# Patient Record
Sex: Female | Born: 1935 | Race: White | Hispanic: No
Health system: Southern US, Community
[De-identification: ages and names within clinical notes are randomized; demographics above are authoritative.]

---

## 2004-07-02 ENCOUNTER — Ambulatory Visit: Payer: Self-pay | Admitting: Ophthalmology

## 2004-07-10 ENCOUNTER — Ambulatory Visit: Payer: Self-pay | Admitting: Ophthalmology

## 2005-08-19 ENCOUNTER — Ambulatory Visit: Payer: Self-pay | Admitting: Internal Medicine

## 2007-04-24 ENCOUNTER — Ambulatory Visit: Payer: Self-pay | Admitting: Internal Medicine

## 2007-07-21 ENCOUNTER — Emergency Department: Payer: Self-pay | Admitting: Emergency Medicine

## 2008-02-15 ENCOUNTER — Ambulatory Visit: Payer: Self-pay | Admitting: Neurology

## 2008-02-25 ENCOUNTER — Other Ambulatory Visit: Payer: Self-pay

## 2008-02-25 ENCOUNTER — Inpatient Hospital Stay: Payer: Self-pay | Admitting: Internal Medicine

## 2009-09-20 ENCOUNTER — Ambulatory Visit: Payer: Self-pay | Admitting: Surgery

## 2010-06-03 ENCOUNTER — Emergency Department: Payer: Self-pay | Admitting: Emergency Medicine

## 2010-06-05 ENCOUNTER — Inpatient Hospital Stay: Payer: Self-pay | Admitting: General Practice

## 2010-09-27 ENCOUNTER — Emergency Department: Payer: Self-pay | Admitting: Unknown Physician Specialty

## 2011-09-18 ENCOUNTER — Ambulatory Visit: Payer: Self-pay | Admitting: Internal Medicine

## 2011-10-10 ENCOUNTER — Observation Stay: Payer: Self-pay | Admitting: Internal Medicine

## 2011-10-10 LAB — CBC WITH DIFFERENTIAL/PLATELET
Eosinophil #: 0.3 10*3/uL (ref 0.0–0.7)
Lymphocyte %: 16.4 %
MCH: 31.5 pg (ref 26.0–34.0)
MCHC: 32.3 g/dL (ref 32.0–36.0)
MCV: 97 fL (ref 80–100)
Neutrophil #: 10.1 10*3/uL — ABNORMAL HIGH (ref 1.4–6.5)
Neutrophil %: 75.3 %
Platelet: 501 10*3/uL — ABNORMAL HIGH (ref 150–440)

## 2011-10-10 LAB — COMPREHENSIVE METABOLIC PANEL
Albumin: 2.7 g/dL — ABNORMAL LOW (ref 3.4–5.0)
Alkaline Phosphatase: 75 U/L (ref 50–136)
Anion Gap: 7 (ref 7–16)
BUN: 23 mg/dL — ABNORMAL HIGH (ref 7–18)
Bilirubin,Total: 0.4 mg/dL (ref 0.2–1.0)
Calcium, Total: 8.7 mg/dL (ref 8.5–10.1)
Co2: 30 mmol/L (ref 21–32)
EGFR (African American): >60
EGFR (Non-African Amer.): >60
Osmolality: 285 (ref 275–301)
SGOT(AST): 17 U/L (ref 15–37)
SGPT (ALT): 15 U/L

## 2011-10-10 LAB — URINALYSIS, COMPLETE
Bilirubin,UR: NEGATIVE
Nitrite: NEGATIVE
Protein: 30
WBC UR: 128 /HPF (ref 0–5)

## 2011-10-10 LAB — PROTIME-INR: INR: 1.1

## 2011-10-10 LAB — APTT: Activated PTT: 31.5 secs (ref 23.6–35.9)

## 2011-10-10 LAB — HEMOGLOBIN: HGB: 13.7 g/dL (ref 12.0–16.0)

## 2011-10-11 LAB — CBC WITH DIFFERENTIAL/PLATELET
Basophil #: 0.1 10*3/uL (ref 0.0–0.1)
HCT: 41.2 % (ref 35.0–47.0)
HGB: 13.2 g/dL (ref 12.0–16.0)
Lymphocyte #: 1.8 10*3/uL (ref 1.0–3.6)
MCHC: 32 g/dL (ref 32.0–36.0)
MCV: 97 fL (ref 80–100)
Monocyte #: 0.6 x10 3/mm (ref 0.2–0.9)
Neutrophil #: 9.6 10*3/uL — ABNORMAL HIGH (ref 1.4–6.5)
Neutrophil %: 77.9 %
RBC: 4.25 10*6/uL (ref 3.80–5.20)
RDW: 16 % — ABNORMAL HIGH (ref 11.5–14.5)
WBC: 12.4 10*3/uL — ABNORMAL HIGH (ref 3.6–11.0)

## 2011-10-11 LAB — BASIC METABOLIC PANEL
Anion Gap: 9 (ref 7–16)
Calcium, Total: 8.2 mg/dL — ABNORMAL LOW (ref 8.5–10.1)
Chloride: 107 mmol/L (ref 98–107)
Co2: 27 mmol/L (ref 21–32)
Creatinine: 0.67 mg/dL (ref 0.60–1.30)
EGFR (Non-African Amer.): >60
Potassium: 3.8 mmol/L (ref 3.5–5.1)
Sodium: 143 mmol/L (ref 136–145)

## 2011-10-14 LAB — URINE CULTURE

## 2011-10-19 ENCOUNTER — Ambulatory Visit: Payer: Self-pay | Admitting: Internal Medicine

## 2012-09-10 IMAGING — CT CT HEAD WITHOUT CONTRAST
2 series · 15 of 30 positions shown, 19 images · non-contrast
Comparison: none

REASON FOR EXAM: trauma
COMMENTS:

[Series 2: without · axial · non-contrast · 0.42mm/px · z∈[+468,+588]mm · 13 of 29 slices shown, 17 images]
[im 3/29  brain]
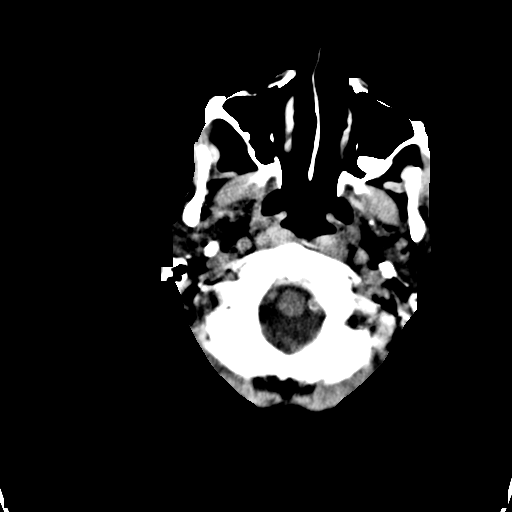
[im 3/29  bone]
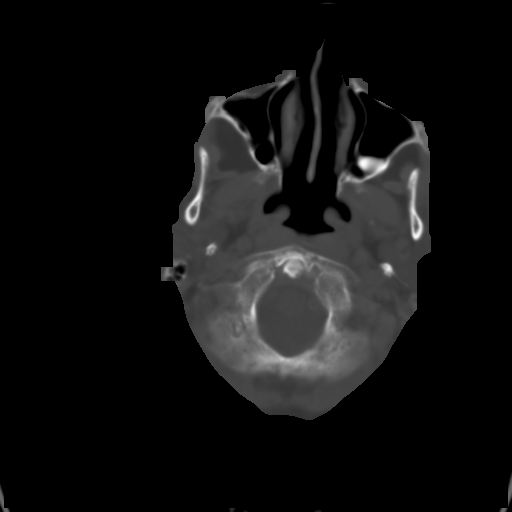
[im 5/29  brain]
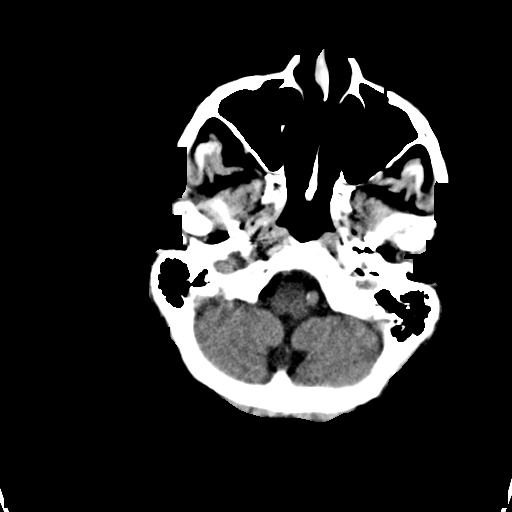
[im 7/29  brain]
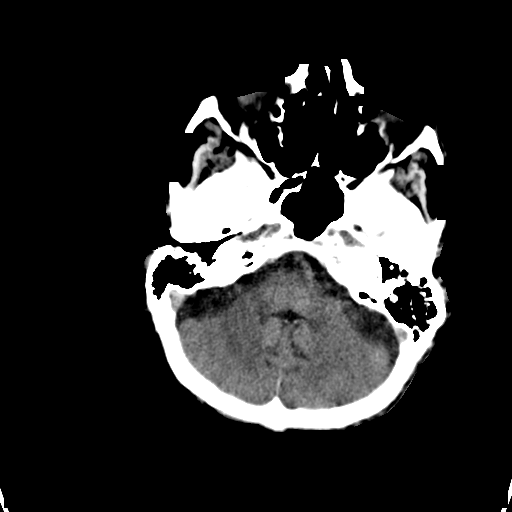
[im 9/29  brain]
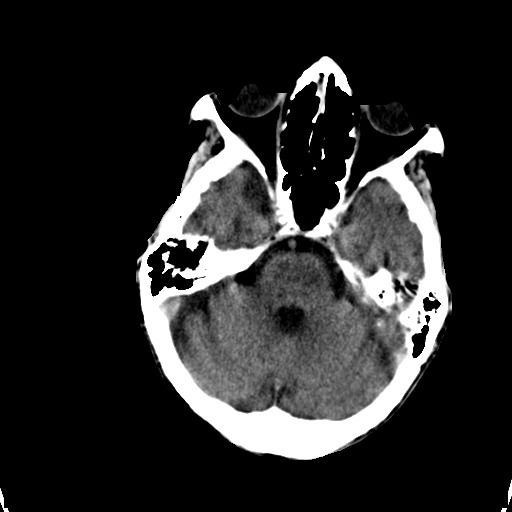
[im 11/29  brain]
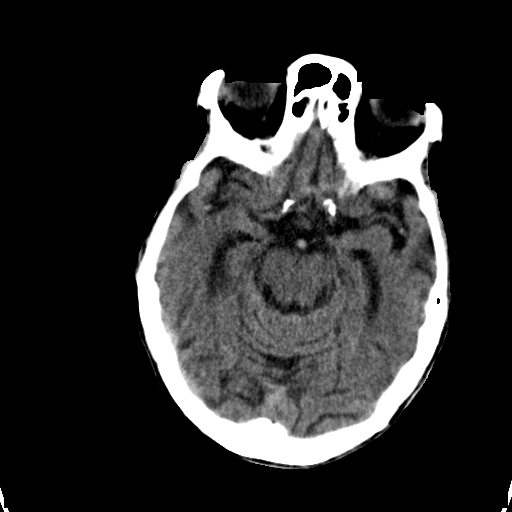
[im 11/29  bone]
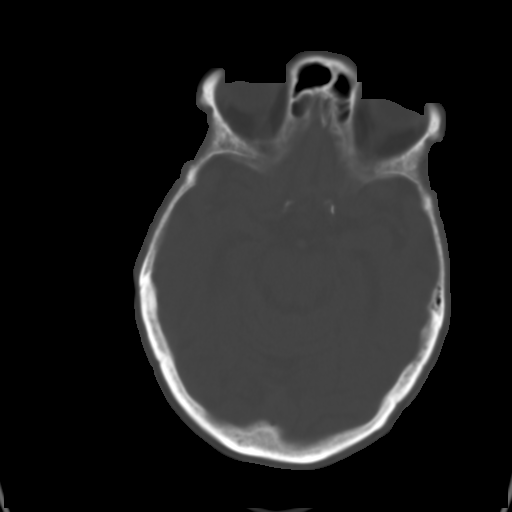
[im 13/29  brain]
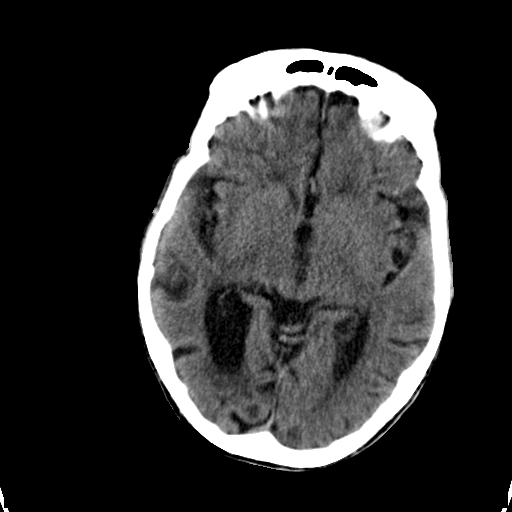
[im 15/29  brain]
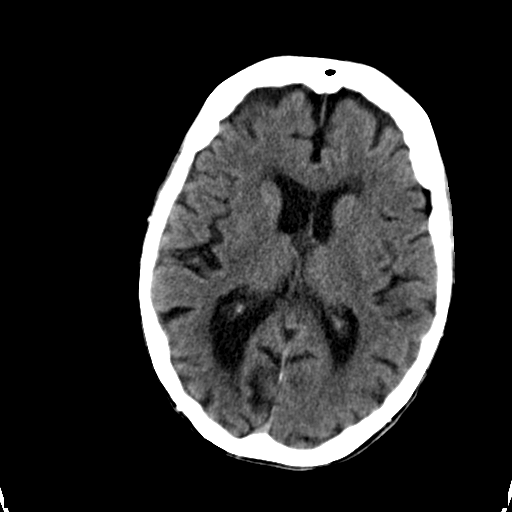
[im 17/29  brain]
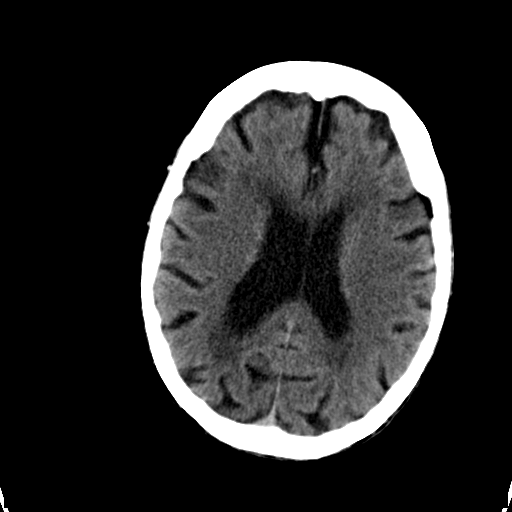
[im 19/29  brain]
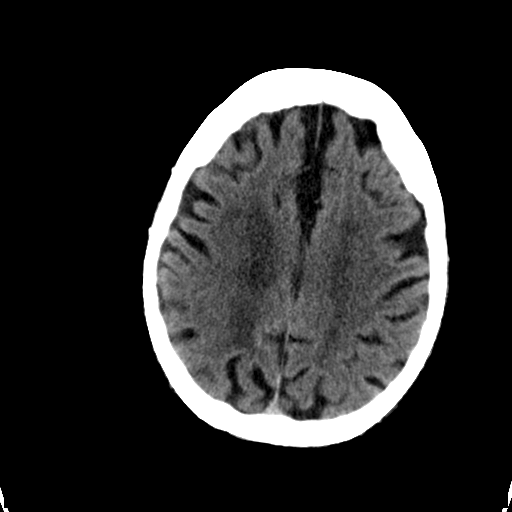
[im 19/29  bone]
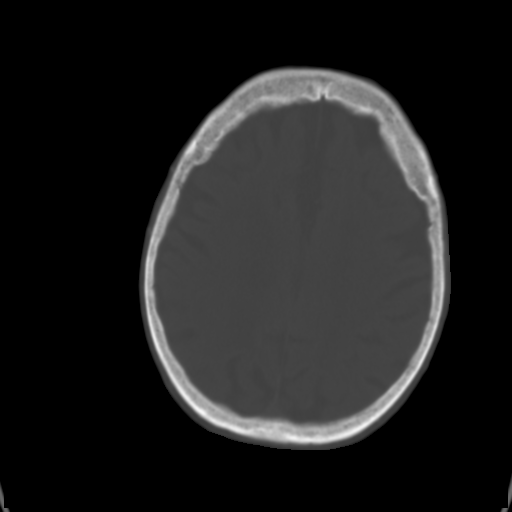
[im 21/29  brain]
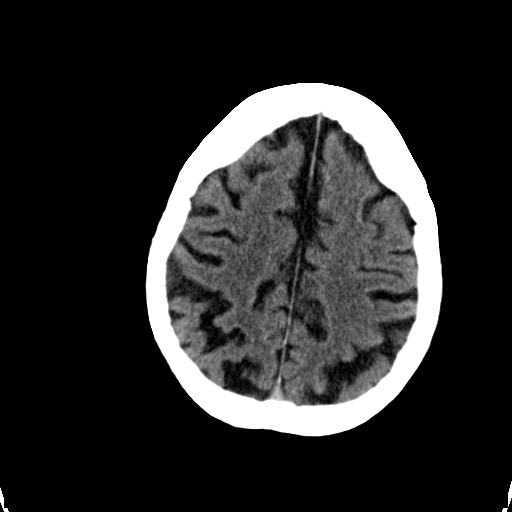
[im 23/29  brain]
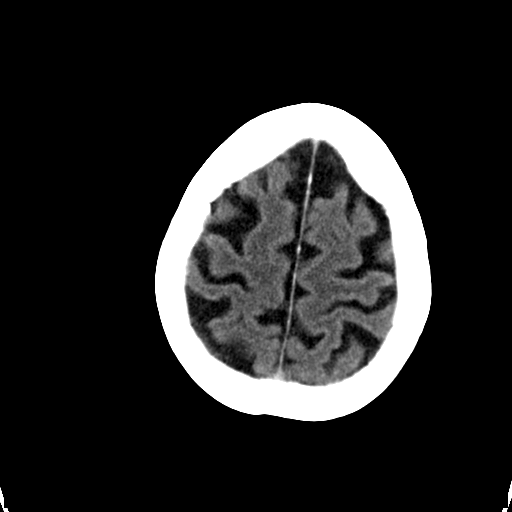
[im 25/29  brain]
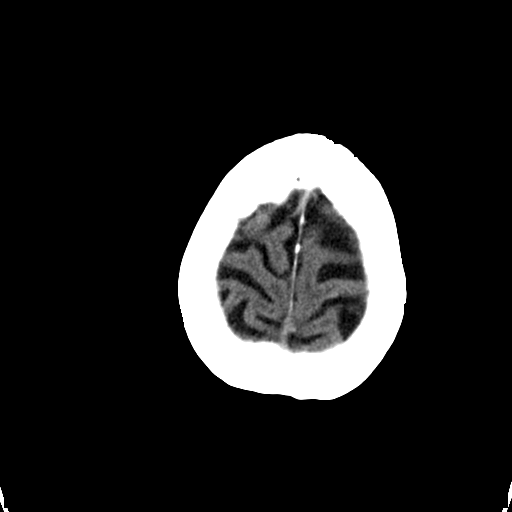
[im 27/29  brain]
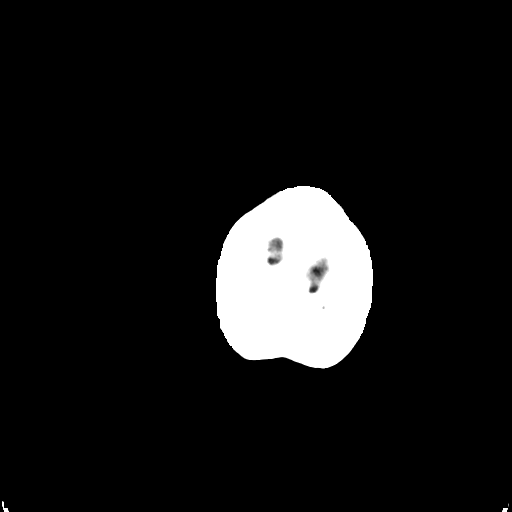
[im 27/29  bone]
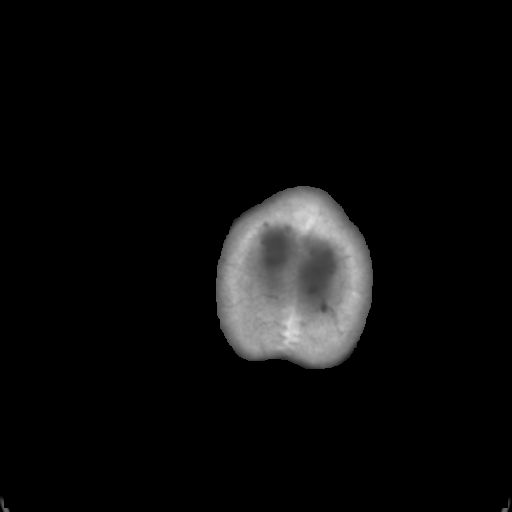

[Series 3: bone · axial · 0.42mm/px · z∈[+468,+488]mm · 2 of 29 slices shown]
[im 3/29  bone]
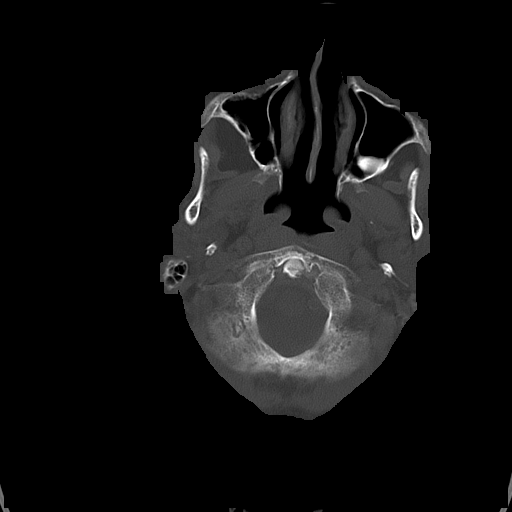
[im 7/29  bone]
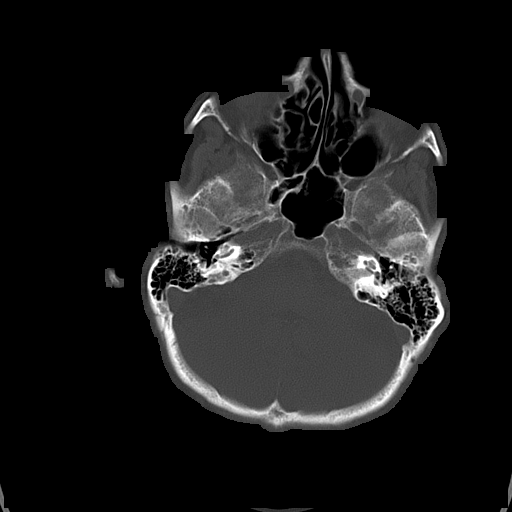

[15 of 30 positions shown; findings below may reference images not displayed]

PROCEDURE:     CT  - CT HEAD WITHOUT CONTRAST  - September 27, 2010  [DATE]

RESULT:     Emergent CT of the brain is compared to previous exam dated
06/03/2010. There is prominence of the ventricles and sulci consistent with
atrophy. Diffuse low-attenuation is present within the periventricular and
subcortical white matter regions most consistent with chronic small vessel
ischemic disease. Tiny old occipital infarcts appear present bilaterally.
Atherosclerotic calcification is present. There is no intracranial
hemorrhage, mass or mass effect. No midline shift is appreciated. The
included sinuses and mastoid air cells show grossly normal aeration. The
calvarium appears intact.
IMPRESSION: 1. Atrophy with chronic small vessel ischemic disease. The appear to be tiny
old occipital infarcts. Somewhat asymmetric atrophy in this region cannot be
excluded. No definite acute intracranial abnormality or significant interval
change appreciated.

## 2013-05-20 DEATH — deceased

## 2013-09-23 IMAGING — CT CT ABD-PELV W/O CM
1 of 2 series · 15 of 32 positions shown, 19 images · non-contrast
Comparison: none

REASON FOR EXAM: (1) abdominal pain, lower gi bleed; (2) lower gi bleed,
h/o colon ca.  r/o mass
COMMENTS:

PROCEDURE:     CT  - CT ABDOMEN AND PELVIS W[DATE]  [DATE]
RESULT:
TECHNIQUE: Helical noncontrasted 3 mm sections were obtained from the lung
bases through the pubic symphysis.

[Series 2: 3mm soft tissue · axial · 0.62mm/px · z∈[-926,-560]mm · 15 of 134 slices shown, 19 images]
[im 6/134  soft-tissue]
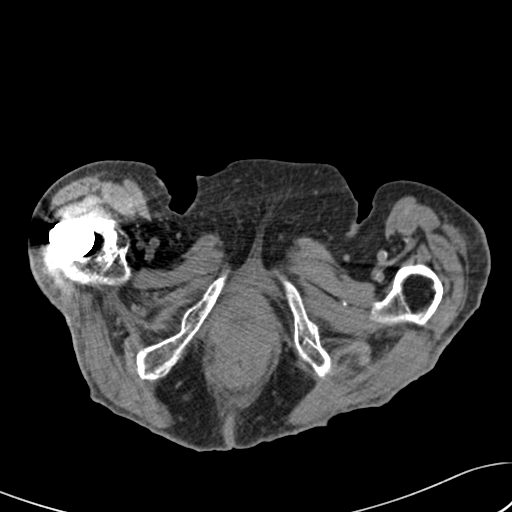
[im 6/134  bone]
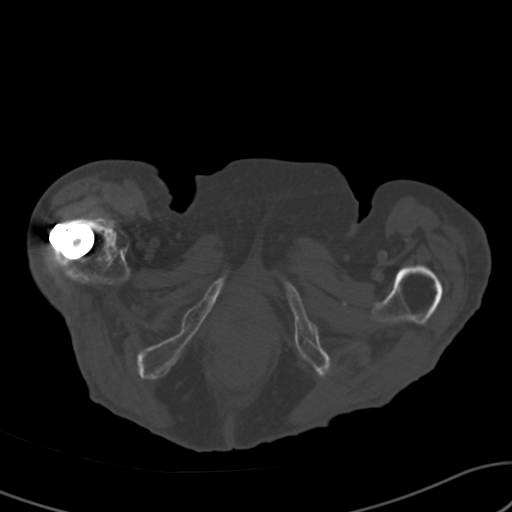
[im 17/134  soft-tissue]
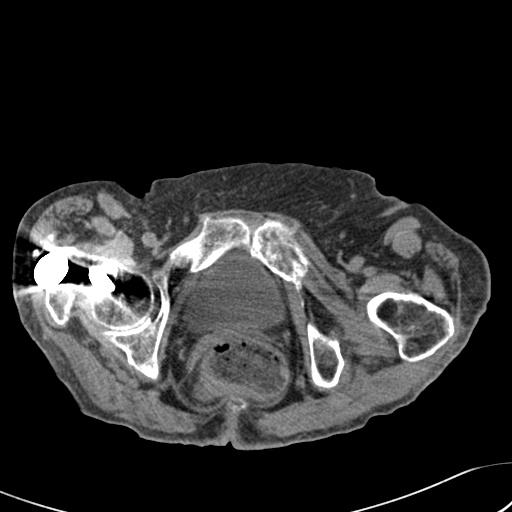
[im 28/134  soft-tissue]
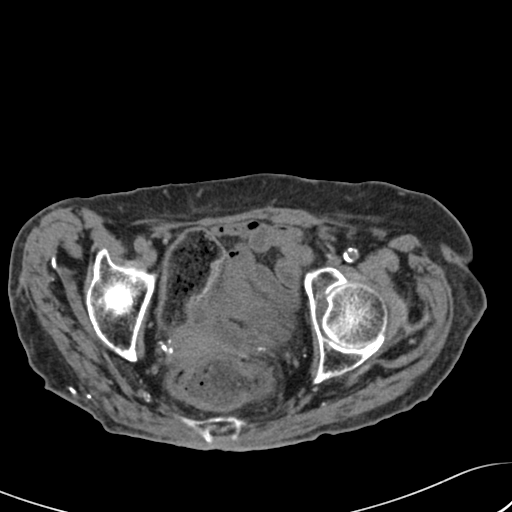
[im 39/134  soft-tissue]
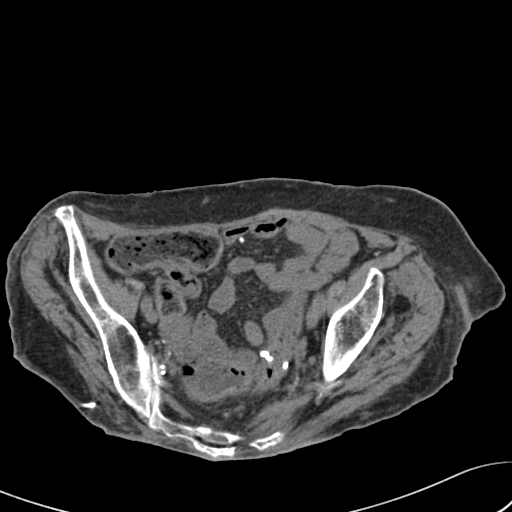
[im 45/134  soft-tissue]
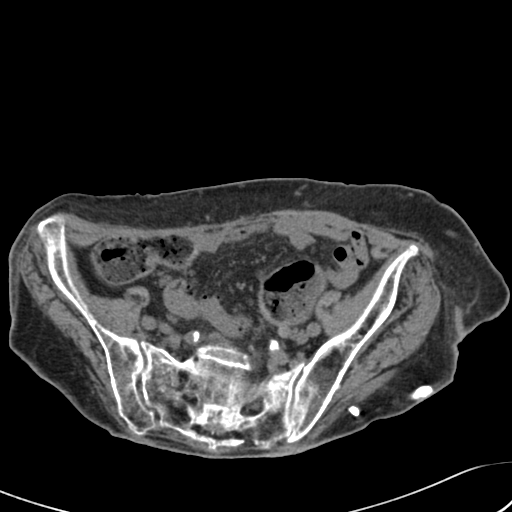
[im 56/134  soft-tissue]
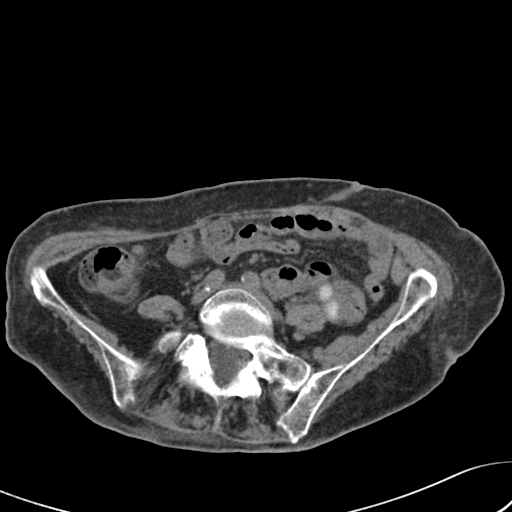
[im 67/134  soft-tissue]
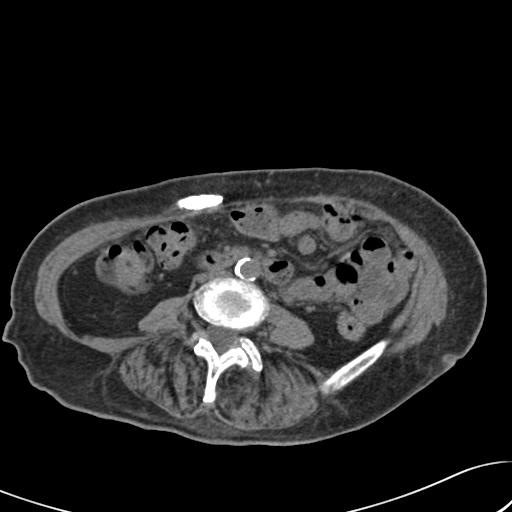
[im 78/134  soft-tissue]
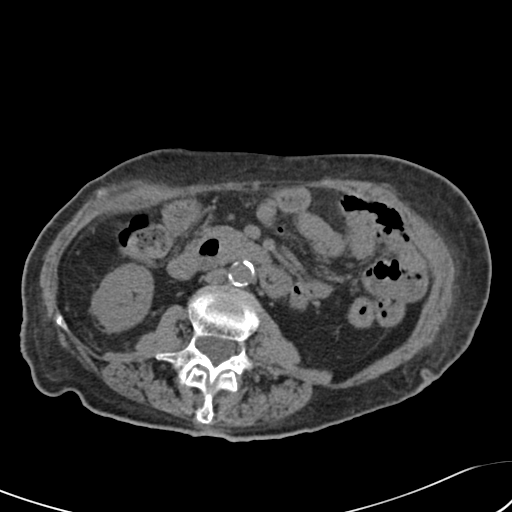
[im 89/134  soft-tissue]
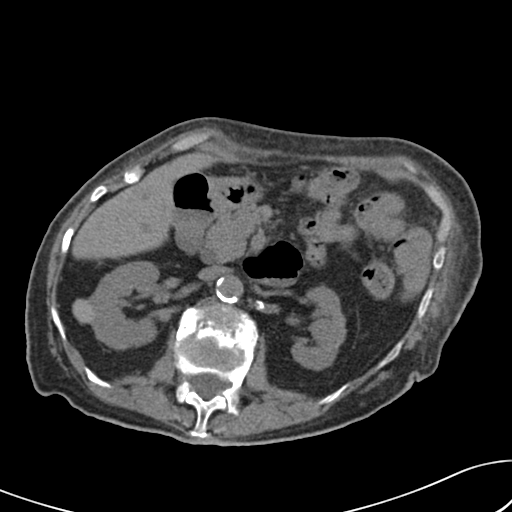
[im 89/134  bone]
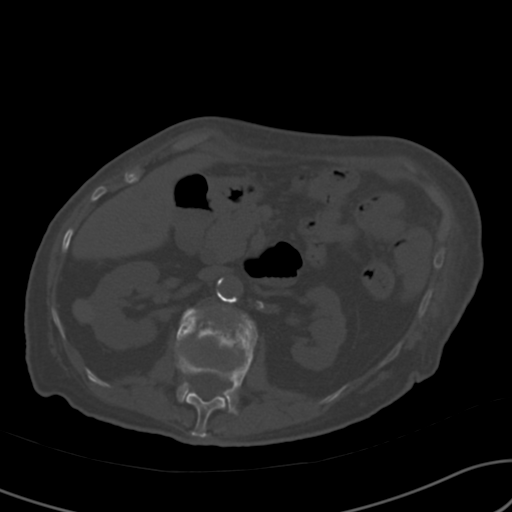
[im 95/134  soft-tissue]
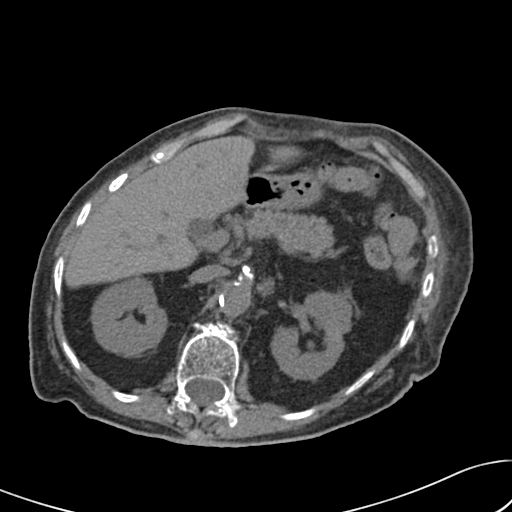
[im 106/134  soft-tissue]
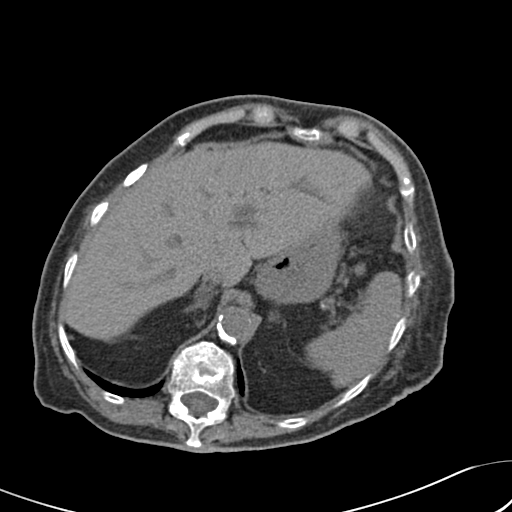
[im 111/134  lung]
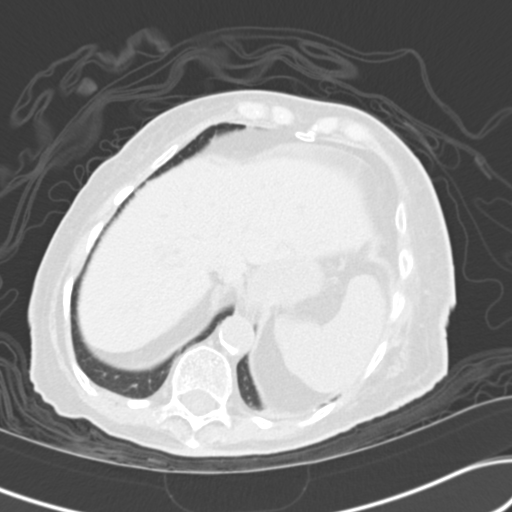
[im 117/134  soft-tissue]
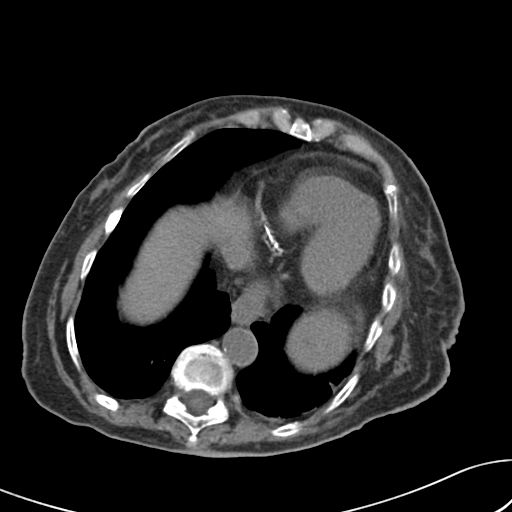
[im 117/134  lung]
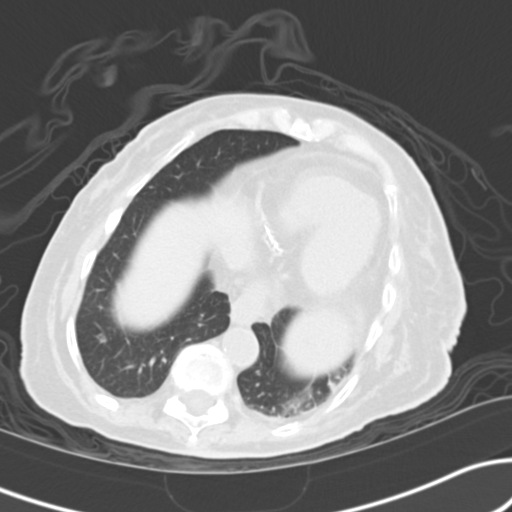
[im 122/134  lung]
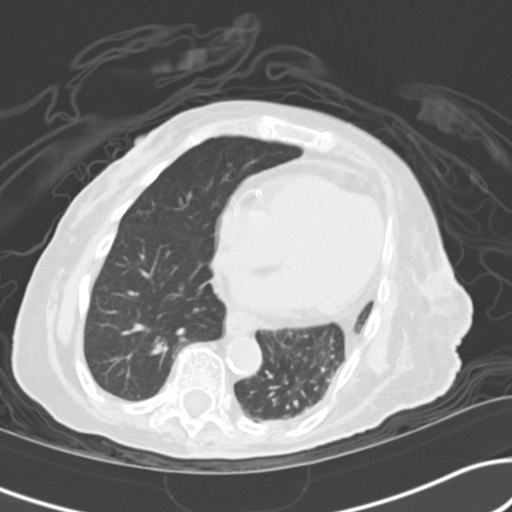
[im 128/134  soft-tissue]
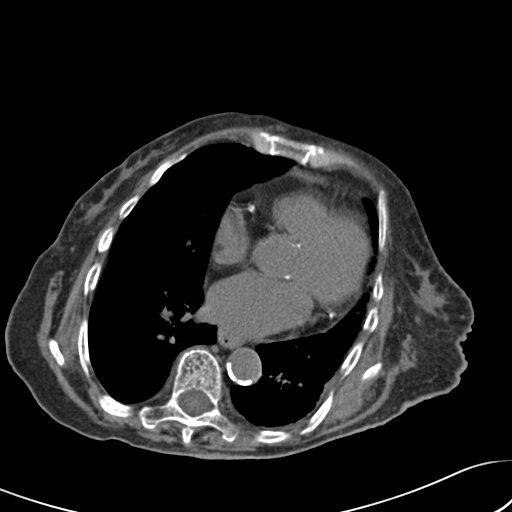
[im 128/134  lung]
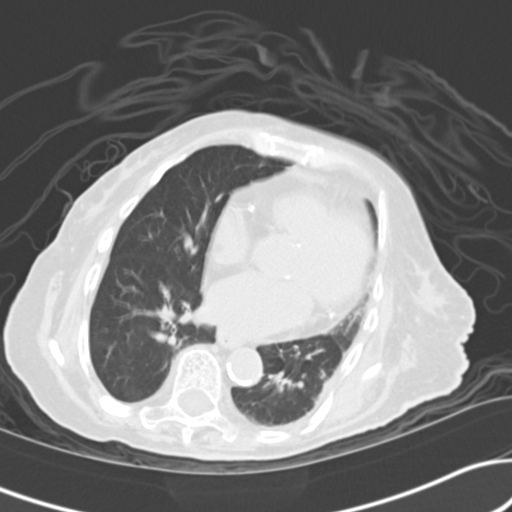

[15 of 32 positions shown; findings below may reference images not displayed]

FINDINGS: The lung bases demonstrate emphysematous changes. There are linear
areas of increased density likely representing a fibrotic component.
Coronary artery calcification is appreciated as well as a small hiatal
hernia.

Noncontrast evaluation of the liver, spleen, and pancreas is unremarkable.
The gallbladder is not identified. There is a hyperdense right renal lesion
measuring 1.5 cm. Cortical scarring of the left kidney is identified.
Bilateral renal cysts are identified. There are calcifications at the margin
of the left renal cyst. There is no hydronephrosis or perinephric edema.

Surgical anastomosis of the rectosigmoid colon is identified. There is no
increased fecal material in the rectosigmoid and ascending colon. There are
two radiopaque disc-like structures in the descending colon, appearance
suggesting pills. There is no abnormal large or small bowel dilation. There
is no bowel wall thickening or edema. There is no evidence of pneumatosis
nor pneumoperitoneum. The appendix is unremarkable. The uterus is tilted to
the right of midline. There is no evidence of adnexal masses. There is no
free fluid in the pelvis. Calcified injection granulomas are identified
within the gluteal regions. Large, coarse calcifications in the right
anterior and mid abdominal wall likely represent injection granulomas. Right
hip pin is identified. Old pubic ramus fracture is identified.
IMPRESSION: 1. No evidence of acute inflammatory or obstructive abnormalities. Moderate
amount of stool is identified.
2. Complex renal cysts. Solid lesion cannot be excluded. This involves the
right kidney.
3. Dr. Andoyo of the Emergency Department was informed of these findings
via a preliminary faxed report dated 10/10/2011 at [DATE] a.m. [employer]
Time.

## 2014-09-11 NOTE — Discharge Summary (Signed)
PATIENT NAME:  Meredith Cain, Sirenity M MR#:  [id number] DATE OF BIRTH:  Jul 20, 1919  DATE OF ADMISSION:  10/10/2011 DATE OF DISCHARGE:  10/11/2011  DISCHARGE DIAGNOSES:  1. Lower gastrointestinal bleed. Patient's power of attorney refused further work-up.  2. Hypertension.  3. Colon cancer.  4. Dementia.  5. Osteoporosis.   DISCHARGE MEDICATIONS:  1. Amlodipine 10 mg daily. 2. Aricept 10 mg p.o. daily.  3. Aspirin to be stopped.  4. Colace 100 mg p.o. b.i.d.  5. Maalox 15 mL once a day at night.  6. Metoprolol 20 mg p.o. b.i.d.  7. MiraLax 17 grams in water.  8. Namenda 10 mg daily. 9. Pristiq 50 mg extended release 1 tablet daily.  10. Ranitidine 15 mg/ mL, take 10 mL that is 150 mg daily.  11. Risperdal 0.25 mg 1 tablet p.o. b.i.d.  12. Senna as needed.   13. Xalatan 0.005% eyedrops in each eye once a day that is at bedtime.    HOSPITAL COURSE: 81 year [facility] because of dark red stools multiple times on 22nd. Patient is a poor historian. According to the patient's granddaughter, patient had tiny bloody stool before and patient complained of epigastric pain without radiation.   Patient is admitted to hospitalist service and monitored on telemetry. Patient did not have any further episodes of rectal bleeding. Initial hemoglobin was 13.1, hematocrit 41.3. Patient did stay hemodynamically stable. Her hemoglobin did not drop and it did stay around 13.7 and most recent one today 13.2 and 41.2. Patient's aspirin was stopped.   Patient has granddaughter, who is , power of attorney and palliative care has seen the patient and has been contacting the power of attorney and she does not want any further workup for etiology of the bleeding. Patient because of hemoglobin stable [facility] Patient is followed by [facility] and CODE STATUS is DO NOT RESUSCITATE.    Urinary tract infection. Patient's urinalysis showed positive  leukocyte esterase and also positive bacteria 3+ and WBC 128. She started Rocephin. Cultures are pending but will empirically send her on Cipro 500 mg b.i.d. for five days.   Hypertension. Blood pressure has been running high with 154/58 with heart rate being 61 so metoprolol dose has been not changed and increased Norvasc to 10 mg daily and she can continue that.   History of dementia. She is on Namenda, Aricept and Risperdal; continue them.   LABORATORY, DIAGNOSTIC AND RADIOLOGICAL DATA: Patient's renal function stayed stable. BUN 13, creatinine 0.67. CT of the abdomen also is done on 23rd which shows lungs bases with emphysema and coronary artery calcifications. Noncontrast evaluation of liver, pancreas, spleen are normal. Gallbladder not identified. Hyperdense right lesion 1.5 cm and cortical scarring of left kidney and bilateral renal cysts are present. Patient has no hydronephrosis. No perinephric edema. Surgical anastomotic of rectosigmoid junction is present. No increase in fecal material at the rectosigmoid junction. Patient has no abnormal appearance of large or small bowel, no bowel wall thickening or edema. No evidence of pneumatosis or pneumoperitoneum. Appendix unremarkable. No free fluid.   CONDITION: Condition is stable.   TIME SPENT ON DISCHARGE PREPARATION: More than 30 minutes.   CODE STATUS: DO NOT RESUSCITATE.  ____________________________ Katha HammingSnehalatha Jamespaul Secrist, MD sk:cms D: 10/11/2011 10:02:03 ET T: 10/14/2011 10:29:27 ET  JOB#: [id number] cc: Katha HammingSnehalatha Shahzad Thomann, MD, <Dictator>  Katha HammingSNEHALATHA Calyn Sivils MD ELECTRONICALLY SIGNED 10/17/2011 11:13

## 2014-09-11 NOTE — H&P (Signed)
PATIENT NAME:  Meredith Cain, Meredith Cain MR#:  [id number] DATE OF BIRTH:  11-14-1919  DATE OF ADMISSION:  10/10/2011  PRIMARY CARE PHYSICIAN: Dr. Harrington Challenger    REFERRING PHYSICIAN: Dr. Jens Som   CHIEF COMPLAINT: Bloody stool yesterday.   HISTORY OF PRESENT ILLNESS: The patient is a 81 year old Caucasian female comes from nursing home with a history of hypertension, diabetes, colon cancer, and dementia who was found to have bloody stool multiple times yesterday which is dark red. The patient is demented and cannot provide detailed history. According to her granddaughter, the patient had tiny bloody stool before. The patient has no nausea, vomiting, or diarrhea. The patient said she has epigastric pain which is intermittent and mild without radiation. She denies any other symptoms. The patient's urinalysis showed urinary tract infection, was treated with Cipro in the ED. According to the patient's granddaughter, she does not want aggressive treatment since the patient is a DO NOT RESUSCITATE code status.   PAST MEDICAL HISTORY:  1. Hypertension.  2. Diabetes.  3. Colon cancer. 4. Dementia.   SOCIAL HISTORY: Nursing home resident since this January. The patient lived with her granddaughter for a long time. No smoking, alcohol drinking, or illicit drugs.   PAST SURGICAL HISTORY:  1. Colon cancer surgery. 2. Total hysterectomy.  3. Right knee surgery.   FAMILY HISTORY: The patient's daughter died of lung cancer. Unclear about her parents.   ALLERGIES: Codeine.   MEDICATIONS:  1. Ambien 5 mg p.o. at bedtime.  2. Norvasc 5 mg p.o. daily.  3. Aricept 10 mg p.o. at bedtime. 4. Aspirin 81 mg p.o. daily.  5. Caltrate 600 with Vitamin D 1 tablet b.i.d. 6. Forteo 600 mcg once sub-Q once daily. 7. Latanoprost ophthalmic 0.05% solution one drop to each eye once daily.  8. Lovenox 40 mg once daily.  9. Lopressor 50 mg extended-release 1 p.o. daily.  10. Multivitamin 1 p.o. daily.  11. Namenda 10 mg p.o. twice  daily. 12. Zocor 20 mg p.o. daily.  13. Vicodin 1 to 2 tablets q.4 to 6 hours p.r.n.   REVIEW OF SYSTEMS: The patient is demented and unable to provide review of systems.    PHYSICAL EXAMINATION:   VITALS: Temperature 97, blood pressure 180/65, pulse 55, respirations 18, oxygen saturation 96% on room air.   GENERAL: The patient is awake, demented in no acute distress.   HEENT: Pupils are round, equal, reactive to light and accommodation. Moist oral mucosa. Clear oropharynx.   NECK: Supple. No JVD or carotid bruit. No lymphadenopathy. No thyromegaly.   CARDIOVASCULAR: S1 and S2 regular rate and rhythm. No murmurs or gallops.   PULMONARY: Bilateral air entry. No wheezing or rales.   ABDOMEN: Soft. No distention or tenderness. No organomegaly. Bowel sounds present.   EXTREMITIES: No edema, clubbing, or cyanosis. No calf tenderness but has rigidity of bilateral legs.   NEUROLOGIC: The patient is demented but no focal deficits.   LABORATORY, DIAGNOSTIC, AND RADIOLOGICAL DATA: Urinalysis showed WBCs 128, RBCs 5, nitrite negative. CBC showed WBC 13.4, hemoglobin 13.4, platelets 501, glucose 127, BUN 23, creatinine 0.76, sodium 140, potassium 3.9, chloride 103, bicarb 30. INR 1.1. PTT 31.5.   EKG showed sinus bradycardia at 52 beats per minute with left axis deviation.   CAT scan of abdomen is pending.   IMPRESSION:  1. GI bleeding.  2. Urinary tract infection.  3. Leukocytosis.  4. Dehydration.  5. Thrombocytosis.  6. Hypertension, uncontrolled.  7. Diabetes.   PLAN OF TREATMENT:  1.  The patient will be placed for observation. Since the patient's granddaughter does not want further GI workup and aggressive treatment, we will hold aspirin and Lovenox and follow-up CBC, abdominal CAT scan, and also we will give Rocephin IVPB for urinary tract infection.  2. Will continue IV fluids. Follow-up BMP.  3. Continue hypertension medications including Norvasc and Lopressor.  4. For  diabetes, we will start sliding scale.   Discussed the patient's situation with the patient's granddaughter. She wants Palliative Care consult for Hospice care and Hospice placement.   CODE STATUS: DO NOT RESUSCITATE.   TIME SPENT: About 60 minutes. ____________________________ Shaune PollackQing Coltin Casher, MD qc:drc D: 10/10/2011 07:35:29 ET T: 10/10/2011 11:33:23 ET JOB#: [id number] cc: Shaune PollackQing Kaileena Obi, MD, <Dictator>, Neomia Dearavid N. Harrington Challengerhies, MD Shaune PollackQING Melvern Ramone MD ELECTRONICALLY SIGNED 10/11/2011 1:08
